# Patient Record
Sex: Female | Born: 1960 | Race: Black or African American | Hispanic: No | Smoking: Current every day smoker
Health system: Southern US, Community
[De-identification: ages and names within clinical notes are randomized; demographics above are authoritative.]

## PROBLEM LIST (undated history)

## (undated) DIAGNOSIS — I10 Essential (primary) hypertension: Secondary | ICD-10-CM

## (undated) HISTORY — PX: REPLACEMENT TOTAL KNEE: SUR1224

## (undated) HISTORY — DX: Essential (primary) hypertension: I10

---

## 2006-12-22 ENCOUNTER — Encounter: Payer: Self-pay | Admitting: Orthopedic Surgery

## 2007-01-06 ENCOUNTER — Encounter: Payer: Self-pay | Admitting: Orthopedic Surgery

## 2007-02-06 ENCOUNTER — Encounter: Payer: Self-pay | Admitting: Orthopedic Surgery

## 2007-03-08 ENCOUNTER — Encounter: Payer: Self-pay | Admitting: Orthopedic Surgery

## 2018-05-31 ENCOUNTER — Other Ambulatory Visit (HOSPITAL_COMMUNITY): Payer: Self-pay | Admitting: Nurse Practitioner

## 2018-05-31 DIAGNOSIS — E049 Nontoxic goiter, unspecified: Secondary | ICD-10-CM

## 2018-06-09 ENCOUNTER — Ambulatory Visit (HOSPITAL_COMMUNITY)
Admission: RE | Admit: 2018-06-09 | Discharge: 2018-06-09 | Disposition: A | Discharge status: Home / Self Care | Payer: Medicare Other | Source: Ambulatory Visit | Attending: Nurse Practitioner | Admitting: Nurse Practitioner

## 2018-06-09 DIAGNOSIS — E049 Nontoxic goiter, unspecified: Secondary | ICD-10-CM | POA: Diagnosis present

## 2018-06-21 ENCOUNTER — Encounter: Payer: Self-pay | Admitting: Internal Medicine

## 2018-07-28 ENCOUNTER — Other Ambulatory Visit: Payer: Self-pay | Admitting: *Deleted

## 2018-07-28 ENCOUNTER — Encounter: Payer: Self-pay | Admitting: Gastroenterology

## 2018-07-28 ENCOUNTER — Ambulatory Visit: Payer: Medicare Other | Admitting: Gastroenterology

## 2018-07-28 ENCOUNTER — Encounter: Payer: Self-pay | Admitting: *Deleted

## 2018-07-28 DIAGNOSIS — R1310 Dysphagia, unspecified: Secondary | ICD-10-CM | POA: Diagnosis not present

## 2018-07-28 NOTE — Patient Instructions (Signed)
1. Barium swallow test as discussed.

## 2018-07-28 NOTE — Progress Notes (Signed)
Primary Care Physician:  Erasmo DownerStrader, Lindsey F, NP  Primary Gastroenterologist:  Roetta SessionsMichael Rourk, MD   Chief Complaint  Patient presents with  . Dysphagia    neck was swollen few months ago; swelling better now/swallowing better    HPI:  Carrie Collier is a 57 y.o. female here at the request of Cheron EveryLindsey Strader, NP for further evaluation of dysphagia.  Patient states her symptoms began about 3 months ago.  Initially she felt some fullness in her neck and throat area.  No appreciable masses felt from the outside.  She had a thyroid ultrasound which was unremarkable.  She felt like the food was sticking in the back of her throat.  Having trouble swallowing it, sometimes the food would be coughed "gargled" back up.  Some heartburn.  No vomiting.  Denies any abdominal pain, bowel concerns.  No melena or rectal bleeding.  No unintentional weight loss.  Denies any postnasal drip, chronic cough.  She reports her last colonoscopy was at age 89, advised to return at age 57.  [place]    Current Outpatient Medications  Medication Sig Dispense Refill  . amLODipine (NORVASC) 5 MG tablet Take 5 mg by mouth daily.  3  . simvastatin (ZOCOR) 20 MG tablet Take 1 tablet by mouth every evening.  2  . zolpidem (AMBIEN) 10 MG tablet Take 10 mg by mouth at bedtime.  3   No current facility-administered medications for this visit.     Allergies as of 07/28/2018 - Review Complete 07/28/2018  Allergen Reaction Noted  . Amoxicillin Hives 07/28/2018    Past Medical History:  Diagnosis Date  . High blood pressure     Past Surgical History:  Procedure Laterality Date  . CESAREAN SECTION    . REPLACEMENT TOTAL KNEE Left     Family History  Problem Relation Age of Onset  . Colon cancer Neg Hx     Social History   Socioeconomic History  . Marital status: Legally Separated    Spouse name: Not on file  . Number of children: Not on file  . Years of education: Not on file  . Highest  education level: Not on file  Occupational History  . Not on file  Social Needs  . Financial resource strain: Not on file  . Food insecurity:    Worry: Not on file    Inability: Not on file  . Transportation needs:    Medical: Not on file    Non-medical: Not on file  Tobacco Use  . Smoking status: Current Every Day Smoker    Types: Cigarettes  . Smokeless tobacco: Never Used  Substance and Sexual Activity  . Alcohol use: Never    Frequency: Never  . Drug use: Never  . Sexual activity: Not on file  Lifestyle  . Physical activity:    Days per week: Not on file    Minutes per session: Not on file  . Stress: Not on file  Relationships  . Social connections:    Talks on phone: Not on file    Gets together: Not on file    Attends religious service: Not on file    Active member of club or organization: Not on file    Attends meetings of clubs or organizations: Not on file    Relationship status: Not on file  . Intimate partner violence:    Fear of current or ex partner: Not on file    Emotionally abused: Not on file  Physically abused: Not on file    Forced sexual activity: Not on file  Other Topics Concern  . Not on file  Social History Narrative  . Not on file      ROS:  General: Negative for anorexia, weight loss, fever, chills, fatigue, weakness. Eyes: Negative for vision changes.  ENT: Negative for hoarseness, difficulty swallowing , nasal congestion. CV: Negative for chest pain, angina, palpitations, dyspnea on exertion, peripheral edema.  Respiratory: Negative for dyspnea at rest, dyspnea on exertion, cough, sputum, wheezing.  GI: See history of present illness. GU:  Negative for dysuria, hematuria, urinary incontinence, urinary frequency, nocturnal urination.  MS: Negative for joint pain, low back pain.  Derm: Negative for rash or itching.  Neuro: Negative for weakness, abnormal sensation, seizure, frequent headaches, memory loss, confusion.  Psych:  Negative for anxiety, depression, suicidal ideation, hallucinations.  Endo: Negative for unusual weight change.  Heme: Negative for bruising or bleeding. Allergy: Negative for rash or hives.    Physical Examination:  BP (!) 150/83   Pulse 77   Temp (!) 97.1 F (36.2 C) (Oral)   Ht 5' 5.5" (1.664 m)   Wt 241 lb (109.3 kg)   BMI 39.49 kg/m    General: Well-nourished, well-developed in no acute distress.  Head: Normocephalic, atraumatic.   Eyes: Conjunctiva pink, no icterus. Mouth: Oropharyngeal mucosa moist and pink , no lesions erythema or exudate. Neck: Supple without thyromegaly, masses, or lymphadenopathy.  Lungs: Clear to auscultation bilaterally.  Heart: Regular rate and rhythm, no murmurs rubs or gallops.  Abdomen: Bowel sounds are normal, nontender, nondistended, no hepatosplenomegaly or masses, no abdominal bruits or    hernia , no rebound or guarding.   Rectal: Not performed  extremities: No lower extremity edema. No clubbing or deformities.  Neuro: Alert and oriented x 4 , grossly normal neurologically.  Skin: Warm and dry, no rash or jaundice.   Psych: Alert and cooperative, normal mood and affect.    Imaging Studies: No results found.

## 2018-07-29 ENCOUNTER — Encounter: Payer: Self-pay | Admitting: Gastroenterology

## 2018-07-29 NOTE — Assessment & Plan Note (Signed)
57 year old female with 3518-month history of dysphagia to solid foods.  Described as food sitting behind the base of her tongue, never really gone down.  Food will come back up.  Thyroid ultrasound negative.  Over the past month she has noted some improvement in her symptoms.  Having some vague heartburn symptoms.  Query Zenker's diverticulum or upper esophageal web/stricture versus oral pharyngeal etiology.  She is significantly improved at this point.  Offered barium pill esophagram for evaluation.  She wants to hold off of PPI for reflux symptoms.  Further recommendations to follow.

## 2018-07-29 NOTE — Progress Notes (Signed)
cc'ed to pcp °

## 2018-08-05 ENCOUNTER — Ambulatory Visit (HOSPITAL_COMMUNITY): Payer: Medicare Other

## 2018-08-11 ENCOUNTER — Ambulatory Visit (HOSPITAL_COMMUNITY): Admission: RE | Admit: 2018-08-11 | Payer: Medicare Other | Source: Ambulatory Visit

## 2018-08-25 ENCOUNTER — Other Ambulatory Visit: Payer: Self-pay

## 2018-08-25 ENCOUNTER — Telehealth: Payer: Self-pay | Admitting: Internal Medicine

## 2018-08-25 DIAGNOSIS — R1310 Dysphagia, unspecified: Secondary | ICD-10-CM

## 2018-08-25 NOTE — Telephone Encounter (Signed)
Pt called office to reschedule swallowing test. Gave her phone number to central scheduling.

## 2018-08-25 NOTE — Telephone Encounter (Signed)
Pt called office, central scheduling told her order had been removed from system. I re-entered DG esophagus order. DG esophagus scheduled for 09/01/18 at 9:00am, arrive at 8:45am. NPO 3 hours prior to test.   Called pt back and informed her of appt. Letter mailed.

## 2018-08-25 NOTE — Telephone Encounter (Signed)
Spoke with patient and has been provided with central scheduling #. She has cancelled dg eso x 2

## 2018-08-25 NOTE — Telephone Encounter (Signed)
[phone] patient needs to be called about a swallowing study

## 2018-09-01 ENCOUNTER — Ambulatory Visit (HOSPITAL_COMMUNITY)
Admission: RE | Admit: 2018-09-01 | Discharge: 2018-09-01 | Disposition: A | Discharge status: Home / Self Care | Payer: Medicare Other | Source: Ambulatory Visit | Attending: Gastroenterology | Admitting: Gastroenterology

## 2018-09-01 DIAGNOSIS — R1310 Dysphagia, unspecified: Secondary | ICD-10-CM | POA: Insufficient documentation

## 2019-10-29 IMAGING — RF DG ESOPHAGUS
10 of 11 series · 13 of 24 positions shown · non-contrast
Comparison: None

CLINICAL DATA: Dysphagia for solids and liquids

EXAM:
ESOPHOGRAM / BARIUM SWALLOW / BARIUM TABLET STUDY
TECHNIQUE: Combined double contrast and single contrast examination performed
using effervescent crystals, thick barium liquid, and thin barium
liquid. The patient was observed with fluoroscopy swallowing a 13 mm
barium sulphate tablet.
FLUOROSCOPY TIME:  Fluoroscopy Time:  1 minutes 48 seconds
Radiation Exposure Index (if provided by the fluoroscopic device):
46.6
Number of Acquired Spot Images: multiple fluoroscopic screen
captures

[Series 1: cp_standard · 0.26mm/px · 1 of 285 frames shown (1 of 10)]
[frame 25/285]
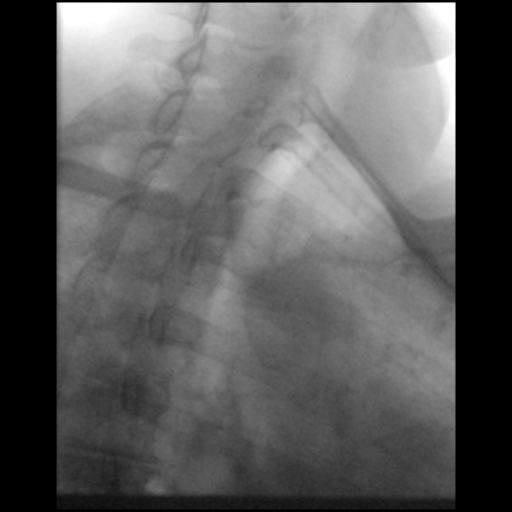

[Series 2: cp_standard · 0.26mm/px · 2 of 207 frames shown (2 of 10)]
[frame 32/207]
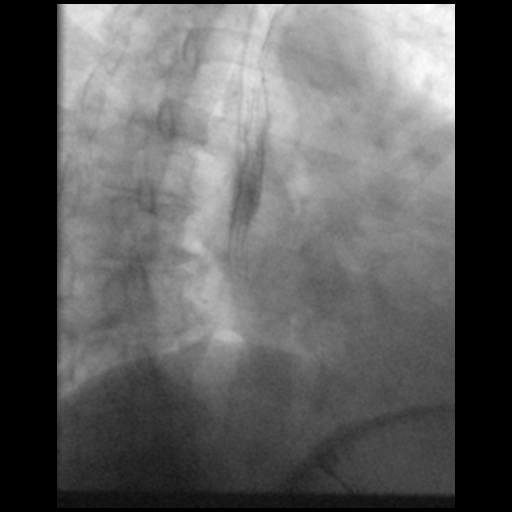
[frame 176/207]
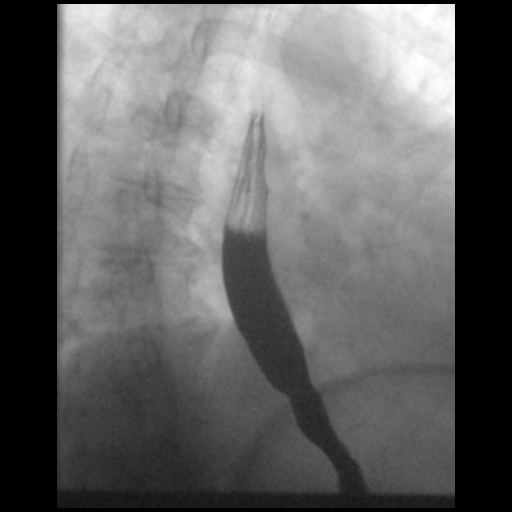

[Series 3: cp_standard · 0.26mm/px · 1 of 145 frames shown (3 of 10)]
[frame 124/145]
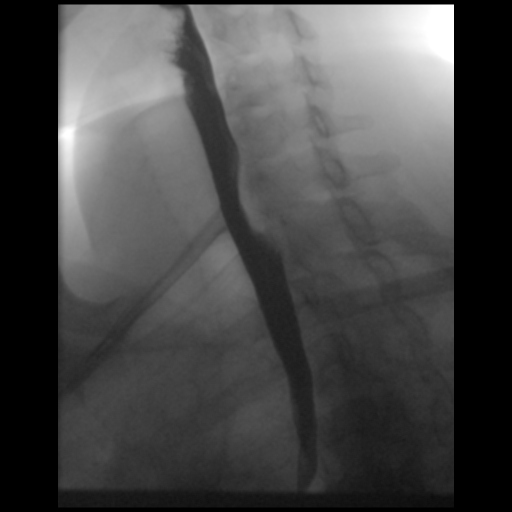

[Series 4: cp_standard · 0.17mm/px · 1 of 115 frames shown (4 of 10)]
[frame 98/115]
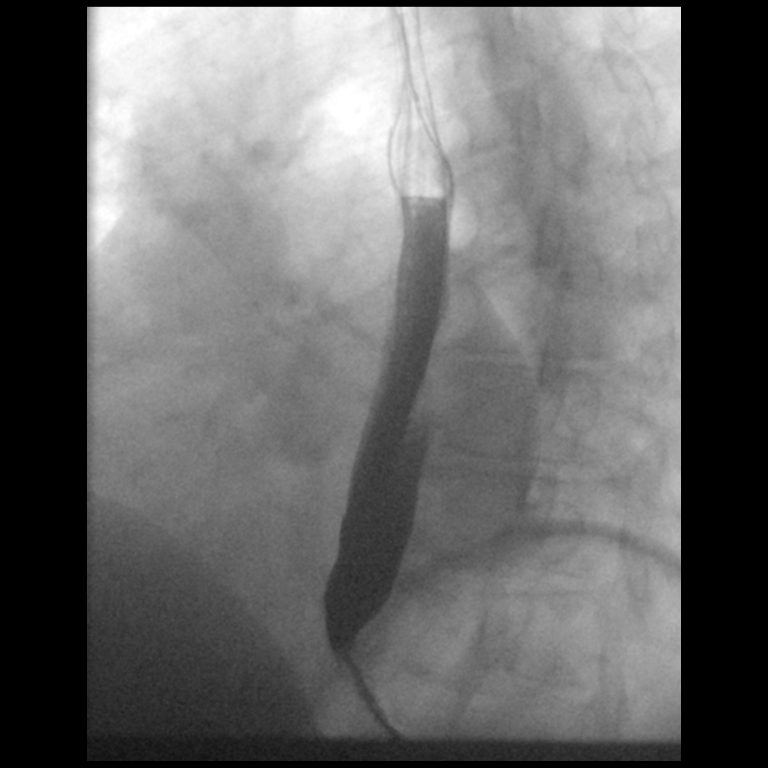

[Series 5: cp_standard · 0.27mm/px · 1 of 187 frames shown (5 of 10)]
[frame 94/187]
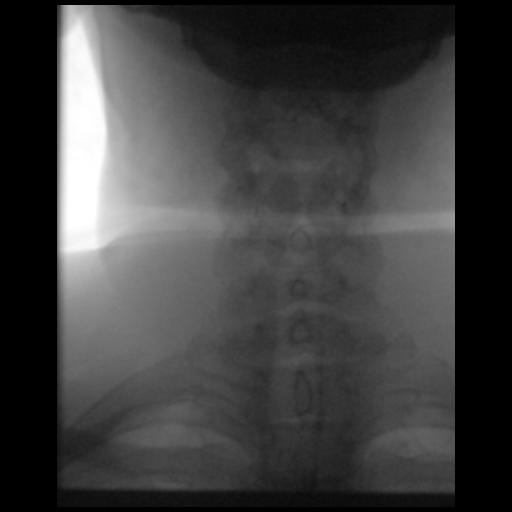

[Series 6: cp_standard · 0.26mm/px · 1 of 136 frames shown (6 of 10)]
[frame 95/136]
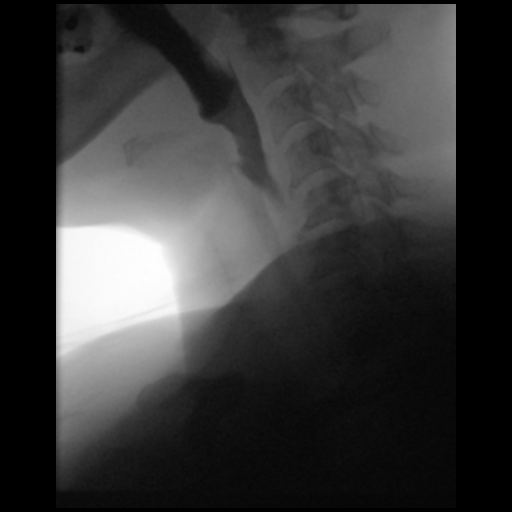

[Series 7: cp_standard · 0.26mm/px · 2 of 178 frames shown (7 of 10)]
[frame 27/178]
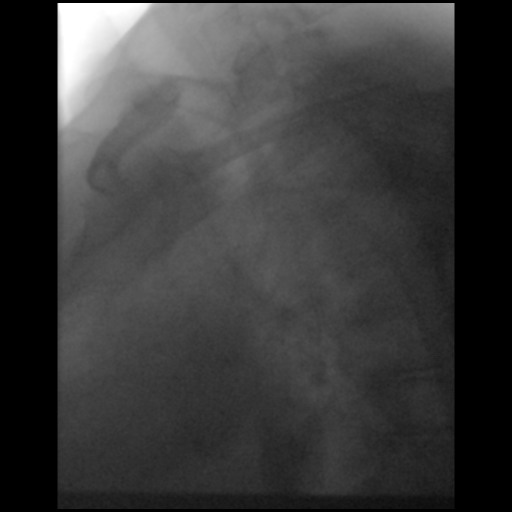
[frame 152/178]
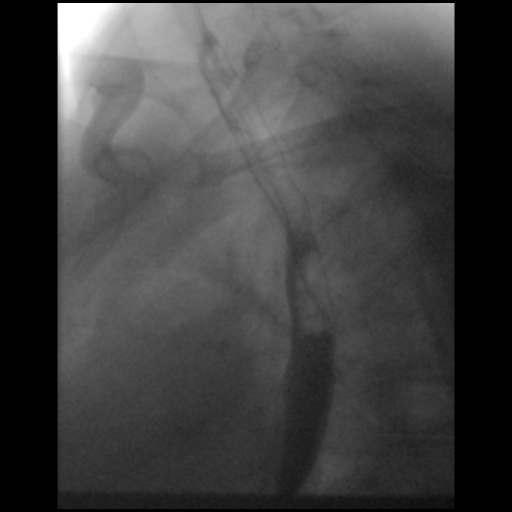

[Series 9: cp_standard · 0.17mm/px · 1 of 62 frames shown (8 of 10)]
[frame 5/62]
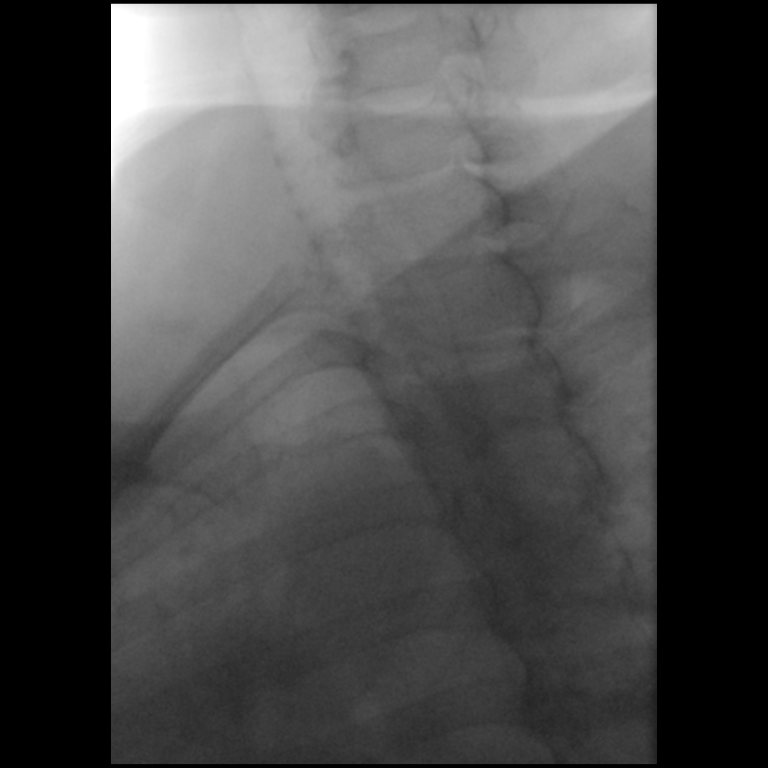

[Series 10: cp_standard · 0.17mm/px · 2 of 84 frames shown (9 of 10)]
[frame 13/84]
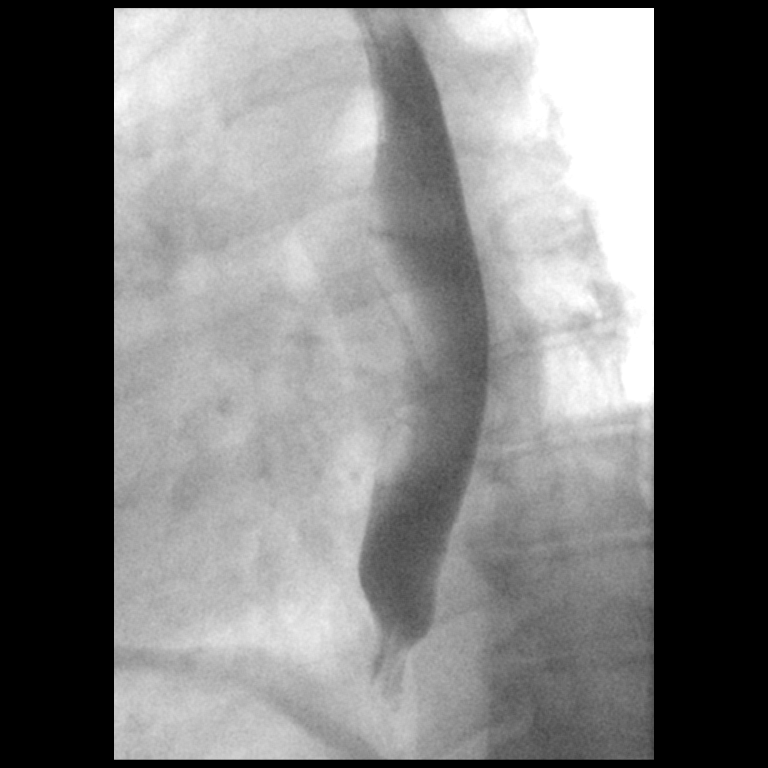
[frame 84/84]
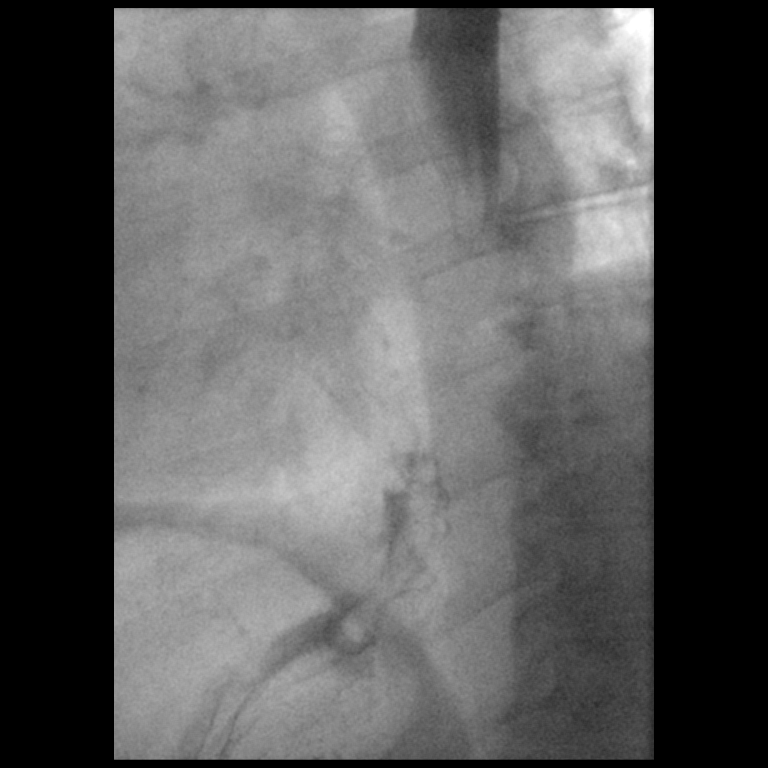

[Series 11: cp_standard · 0.17mm/px · 1 of 71 frames shown (10 of 10)]
[frame 61/71]
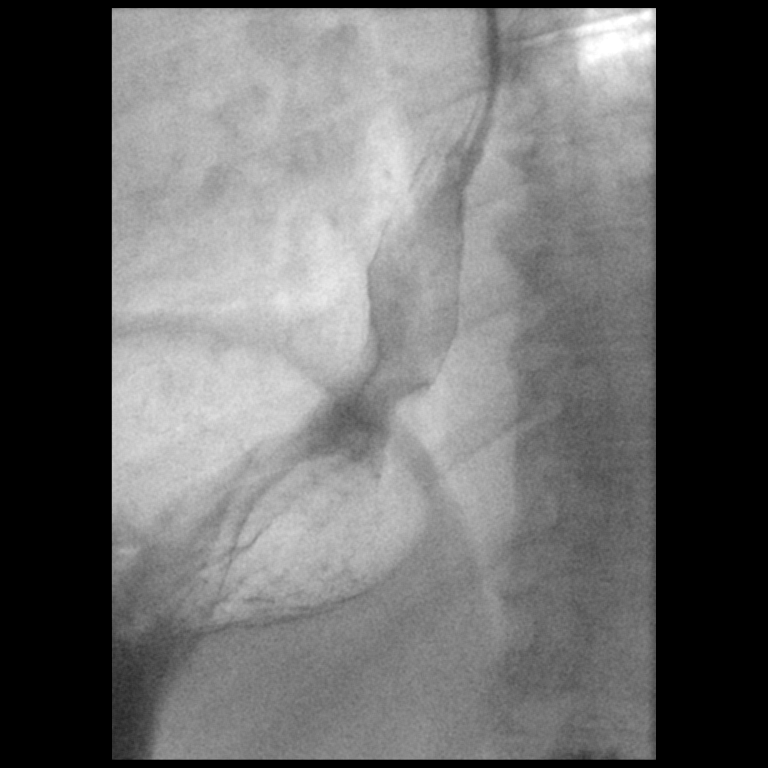

[13 of 24 positions shown; findings below may reference images not displayed]

FINDINGS: Esophageal distention: Normal without mass or stricture

Filling defects:  None

12.5 mm barium tablet: Easily passed from oral cavity to stomach
without obstruction

Motility:  Age-appropriate

Mucosa:  Smooth without irregularity or ulceration

Hypopharynx/cervical esophagus: Normal motion without laryngeal
penetration, aspiration or residuals

Hiatal hernia:  Absent

GE reflux:  None witnessed during exam

Other:  N/A
IMPRESSION: Unremarkable esophagram.
